# Patient Record
Sex: Male | Born: 1995 | Race: Black or African American | Hispanic: No | Marital: Single | Smoking: Current every day smoker
Health system: Southern US, Community
[De-identification: ages and names within clinical notes are randomized; demographics above are authoritative.]

## PROBLEM LIST (undated history)

## (undated) DIAGNOSIS — F909 Attention-deficit hyperactivity disorder, unspecified type: Secondary | ICD-10-CM

## (undated) HISTORY — DX: Attention-deficit hyperactivity disorder, unspecified type: F90.9

---
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
## 2013-03-09 ENCOUNTER — Ambulatory Visit (INDEPENDENT_AMBULATORY_CARE_PROVIDER_SITE_OTHER): Payer: Medicaid Other | Admitting: Family Medicine

## 2013-03-09 ENCOUNTER — Encounter: Payer: Self-pay | Admitting: Family Medicine

## 2013-03-09 VITALS — BP 110/68 | HR 68 | Temp 98.9°F | Resp 14 | Ht 74.5 in | Wt 203.0 lb

## 2013-03-09 DIAGNOSIS — F909 Attention-deficit hyperactivity disorder, unspecified type: Secondary | ICD-10-CM

## 2013-03-09 MED ORDER — AMPHETAMINE-DEXTROAMPHET ER 20 MG PO CP24: 20.0000 mg | ORAL_CAPSULE | ORAL | Status: DC

## 2013-03-09 NOTE — Progress Notes (Signed)
Subjective:    Patient ID: Matthew Lucero, male    DOB: [birth year], 17 y.o.   MRN: [id number]  HPI 09/23/12 Patient reports an itchy rash on both forearms and his face for 5-6 days now. He got it while working outside. He believes his poison oak. He is extremely pruritic. He is an erythematous maculo papular rash on both forearms as well as his face. There are no vesicles.  He denies fevers or chills. He has tried over-the-counter creams without benefit.  He has had a history of ADD. He was previously treated in this clinic with Adderall XR 20 mg by mouth daily. He would like to resume the medication. 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 He states he needs medication to help him focus and studying school.  His been off medication for over a year and reports a difficult time focusing in high school. He was able to maintain attention in high school due to due fact that some of classes relatively easy. However he is concerned about some of the more difficult classes.  At that time, my plan was: 1. Contact dermatitis - predniSONE (DELTASONE) 20 MG tablet; 3 tabs poqday 1-2, 2 tabs poqday 3-4, 1 tab poqday 5-6  Dispense: 12 tablet; Refill: 0   2. ADHD (attention deficit hyperactivity disorder) Once patient is off prednisone, I would gladly refill his Adderall XR 20 mg by mouth every morning..  03/09/13 Patient is scheduled to start [employer] in January. He did not attend this semester due to problems with his financial aid. He is scheduling his Spring classes now. He is requesting a refill on Adderall XR 20 mg by mouth daily. He has difficult time focusing in difficult classes. He also has a difficult time with organization and studying.  He denies any, of hyperactivity or impulsivity. He denies any chest pain or palpitations. He denies any  insomnia. He denies any anxiety problems. He has a normal appetite and is at  an appropriate weight for his height  sReview of Systems  All other systems reviewed and are negative.   Past Medical History  Diagnosis Date  . ADHD (attention deficit hyperactivity disorder)    No current outpatient prescriptions on file prior to visit.   No current facility-administered medications on file prior to visit.   No Known Allergies History   Social History  . Marital Status: Single    Spouse Name: N/A    Number of Children: N/A  . Years of Education: N/A   Occupational History  . Not on file.   Social History Main Topics  . Smoking status: Current Every Day Smoker    Types: Cigarettes, Cigars  . Smokeless tobacco: Not on file     Comment: 2-3 cigs a day/Occasional cigar  . Alcohol Use: No  . Drug Use: No  . Sexual Activity: Not on file   Other Topics Concern  . Not on file   Social History Narrative  . No narrative on file       Objective:   Physical Exam  Vitals reviewed. Constitutional: He appears well-developed and well-nourished.  Cardiovascular: Normal rate and regular rhythm.   Pulmonary/Chest: Effort normal and breath sounds normal.  Skin: Skin is warm. Rash noted. There is erythema.          Assessment & Plan:  1. ADHD (attention deficit hyperactivity disorder) Resume Adderall XR 20 mg by mouth every morning. Recheck in one month. I recommended drug holidays on weekends and prolonged vacations. He is to call me immediately if he develops chest palpitations, anxiety, weight loss, insomnia. - amphetamine-dextroamphetamine (ADDERALL XR) 20 MG 24 hr capsule; Take 1 capsule (20 mg total) by mouth every morning.  Dispense: 30 capsule; Refill: 0

## 2013-03-12 ENCOUNTER — Emergency Department (HOSPITAL_COMMUNITY)
Admission: EM | Admit: 2013-03-12 | Discharge: 2013-03-12 | Disposition: A | Discharge status: Home / Self Care | Payer: Medicaid Other | Attending: Emergency Medicine | Admitting: Emergency Medicine

## 2013-03-12 ENCOUNTER — Emergency Department (HOSPITAL_COMMUNITY): Payer: Medicaid Other

## 2013-03-12 ENCOUNTER — Encounter (HOSPITAL_COMMUNITY): Payer: Self-pay | Admitting: Emergency Medicine

## 2013-03-12 DIAGNOSIS — R112 Nausea with vomiting, unspecified: Secondary | ICD-10-CM | POA: Insufficient documentation

## 2013-03-12 DIAGNOSIS — R079 Chest pain, unspecified: Secondary | ICD-10-CM

## 2013-03-12 DIAGNOSIS — R109 Unspecified abdominal pain: Secondary | ICD-10-CM | POA: Insufficient documentation

## 2013-03-12 DIAGNOSIS — R072 Precordial pain: Secondary | ICD-10-CM | POA: Insufficient documentation

## 2013-03-12 DIAGNOSIS — Z79899 Other long term (current) drug therapy: Secondary | ICD-10-CM | POA: Insufficient documentation

## 2013-03-12 DIAGNOSIS — F909 Attention-deficit hyperactivity disorder, unspecified type: Secondary | ICD-10-CM | POA: Insufficient documentation

## 2013-03-12 DIAGNOSIS — F17200 Nicotine dependence, unspecified, uncomplicated: Secondary | ICD-10-CM | POA: Insufficient documentation

## 2013-03-12 DIAGNOSIS — J029 Acute pharyngitis, unspecified: Secondary | ICD-10-CM

## 2013-03-12 MED ORDER — RANITIDINE HCL 150 MG PO CAPS: 150.0000 mg | ORAL_CAPSULE | Freq: Every day | ORAL | Status: DC

## 2013-03-12 NOTE — ED Provider Notes (Signed)
CSN: [id number]     Arrival date & time 03/12/13  1544 History  This chart was scribed for non-physician practitioner Fayrene Helper, PA-C working with Bonnita Levan. Bernette Mayers, MD by Danella Maiers, ED Scribe. This patient was seen in room TR09C/TR09C and the patient's care was started at 5:18 PM.  Chief Complaint  Patient presents with  . Chest Pain  . URI   Patient is a 17 y.o. male presenting with chest pain and URI. The history is provided by the patient. No language interpreter was used.  Chest Pain Pain location:  Substernal area Pain quality: burning and stabbing   Pain radiates to:  Does not radiate Pain severity:  Mild Duration:  12 hours Progression:  Resolved Chronicity:  New Context: at rest   Associated symptoms: abdominal pain, diaphoresis, dysphagia, fever, nausea and vomiting   Associated symptoms: no cough, no dizziness, no headache and no shortness of breath   URI Presenting symptoms: congestion, fever and sore throat   Presenting symptoms: no cough and no ear pain   Severity:  Mild Onset quality:  Gradual Duration:  12 hours Timing:  Constant Progression:  Worsening Chronicity:  New Relieved by:  Nothing Worsened by:  Nothing tried Associated symptoms: no headaches    HPI Comments: Matthew Lucero is a 17 y.o. male who presents to the Emergency Department complaining of URI and stabbing, burning CP that woke him from sleep at 5am this morning. He states the CP has mostly resolved. He reports throat pain and pain with swallowing, subjective fever, chills, diaphoresis, postnasal drip, congestion.  He reports nausea and emesis with resulting abdominal pain and not being able to keep food down today, although he has been able to keep water down. He ate at 10pm last night. He denies h/o CP. He denies recent surgeries, recent travels, recently sitting for long periods, h/o DVT or PE. He denies headache, cough, SOB, rash, ear pain, dizziness, lightheadedness. Denies hematochezia,  hemoptysis, or melena.  He smokes 1-3 cigarettes per day.  No FH of premature cardiac death.  No specific treatment tried. Report wrestling with friends yesterday and also stay out in the cold weather.     Past Medical History  Diagnosis Date  . ADHD (attention deficit hyperactivity disorder)    History reviewed. No pertinent past surgical history. History reviewed. No pertinent family history. History  Substance Use Topics  . Smoking status: Current Every Day Smoker    Types: Cigarettes, Cigars  . Smokeless tobacco: Not on file     Comment: 2-3 cigs a day/Occasional cigar  . Alcohol Use: No    Review of Systems  Constitutional: Positive for fever, chills and diaphoresis.  HENT: Positive for congestion, postnasal drip, sore throat and trouble swallowing. Negative for ear pain.   Respiratory: Negative for cough and shortness of breath.   Cardiovascular: Positive for chest pain.  Gastrointestinal: Positive for nausea, vomiting and abdominal pain.  Skin: Negative for rash.  Neurological: Negative for dizziness, light-headedness and headaches.    Allergies  Review of patient's allergies indicates no known allergies.  Home Medications   Current Outpatient Rx  Name  Route  Sig  Dispense  Refill  . amphetamine-dextroamphetamine (ADDERALL XR) 20 MG 24 hr capsule   Oral   Take 1 capsule (20 mg total) by mouth every morning.   30 capsule   0    BP 128/72  Pulse 84  Temp(Src) 97.9 F (36.6 C) (Oral)  Resp 18  SpO2 99% Physical Exam  Nursing note and vitals reviewed. Constitutional: He is oriented to person, place, and time. He appears well-developed and well-nourished. No distress.  HENT:  Head: Normocephalic and atraumatic.  Eyes: EOM are normal.  Neck: Neck supple. No tracheal deviation present.  Cardiovascular: Normal rate and regular rhythm.   Pulmonary/Chest: Effort normal and breath sounds normal. No respiratory distress.  ttp on the chest wall without crepitus,  emphysema, paradoxical chest movement.  Musculoskeletal: Normal range of motion.  Neurological: He is alert and oriented to person, place, and time.  Skin: Skin is warm and dry.  Psychiatric: He has a normal mood and affect. His behavior is normal.    ED Course  Procedures (including critical care time) Medications - No data to display  DIAGNOSTIC STUDIES: Oxygen Saturation is 99% on RA, normal by my interpretation.    COORDINATION OF CARE: 5:25 PM- Discussed treatment plan with pt which includes CXR. Pt agrees to plan.  Pt with CP atypical for ACS.  Is PERC negative.  Is afebrile, VSS.  No acute emergent medical condition, no active chest pain.  Pain could be GERD based on description, but also has some MSK chest wall pain.  Will d/c with zantac, return precaution given.  Smoking cessation discussed.  Otherwise ECG without acute ischemic changes.  Pt currently sxs free.  CXR neg.    Labs Review Labs Reviewed - No data to display Imaging Review Dg Chest 2 View  03/12/2013   CLINICAL DATA:  Chest pain  EXAM: CHEST  2 VIEW  COMPARISON:  No prior.  FINDINGS: Mediastinum and hilar structures normal. Lungs are clear. Heart size normal. No acute bony abnormality. No pneumothorax.  IMPRESSION: No active cardiopulmonary disease.   Electronically Signed   By: Maisie Fus  Register   On: 03/12/2013 16:28    EKG Interpretation     Ventricular Rate:  82 PR Interval:  170 QRS Duration: 86 QT Interval:  356 QTC Calculation: 415 R Axis:   87 Text Interpretation:  Normal sinus rhythm with sinus arrhythmia Normal ECG No old tracing to compare            MDM   1. Chest pain at rest   2. Sore throat    BP 128/72  Pulse 84  Temp(Src) 97.9 F (36.6 C) (Oral)  Resp 18  SpO2 99%  I have reviewed nursing notes and vital signs. I personally reviewed the imaging tests through PACS system  I reviewed available ER/hospitalization records thought the EMR   I personally performed the  services described in this documentation, which was scribed in my presence. The recorded information has been reviewed and is accurate.    Fayrene Helper, PA-C 03/12/13 1747

## 2013-03-12 NOTE — ED Notes (Signed)
Pt reports waking up this am with chest pain, throat pain, abd pain and congestion. Denies cough. ekg done at triage, no distress noted.

## 2013-03-12 NOTE — ED Provider Notes (Signed)
Medical screening examination/treatment/procedure(s) were performed by non-physician practitioner and as supervising physician I was immediately available for consultation/collaboration.   Charles B. Bernette Mayers, MD 03/12/13 2050

## 2013-03-12 NOTE — ED Notes (Signed)
Pt st's when he woke up this am he had sore throat and nonproductive cough.  Pt also c/o nausea without vomiting.

## 2013-07-18 ENCOUNTER — Emergency Department (HOSPITAL_COMMUNITY)
Admission: EM | Admit: 2013-07-18 | Discharge: 2013-07-18 | Disposition: A | Discharge status: Home / Self Care | Payer: Medicaid Other | Attending: Emergency Medicine | Admitting: Emergency Medicine

## 2013-07-18 ENCOUNTER — Encounter (HOSPITAL_COMMUNITY): Payer: Self-pay | Admitting: Emergency Medicine

## 2013-07-18 DIAGNOSIS — Z79899 Other long term (current) drug therapy: Secondary | ICD-10-CM | POA: Insufficient documentation

## 2013-07-18 DIAGNOSIS — F17200 Nicotine dependence, unspecified, uncomplicated: Secondary | ICD-10-CM | POA: Insufficient documentation

## 2013-07-18 DIAGNOSIS — R112 Nausea with vomiting, unspecified: Secondary | ICD-10-CM | POA: Insufficient documentation

## 2013-07-18 DIAGNOSIS — F909 Attention-deficit hyperactivity disorder, unspecified type: Secondary | ICD-10-CM | POA: Insufficient documentation

## 2013-07-18 DIAGNOSIS — R109 Unspecified abdominal pain: Secondary | ICD-10-CM | POA: Insufficient documentation

## 2013-07-18 LAB — COMPREHENSIVE METABOLIC PANEL
ALBUMIN: 4.5 g/dL (ref 3.5–5.2)
ALT: 12 U/L (ref 0–53)
AST: 22 U/L (ref 0–37)
Alkaline Phosphatase: 89 U/L (ref 39–117)
BUN: 10 mg/dL (ref 6–23)
CO2: 27 mEq/L (ref 19–32)
Calcium: 9.7 mg/dL (ref 8.4–10.5)
Chloride: 99 mEq/L (ref 96–112)
Creatinine, Ser: 0.99 mg/dL (ref 0.50–1.35)
GLUCOSE: 94 mg/dL (ref 70–99)
POTASSIUM: 4.1 meq/L (ref 3.7–5.3)
Sodium: 140 mEq/L (ref 137–147)
Total Bilirubin: 0.8 mg/dL (ref 0.3–1.2)
Total Protein: 7.5 g/dL (ref 6.0–8.3)

## 2013-07-18 LAB — CBC WITH DIFFERENTIAL/PLATELET
BASOS ABS: 0 10*3/uL (ref 0.0–0.1)
Basophils Relative: 0 % (ref 0–1)
EOS ABS: 0.3 10*3/uL (ref 0.0–0.7)
EOS PCT: 4 % (ref 0–5)
HCT: 45.7 % (ref 39.0–52.0)
Hemoglobin: 16 g/dL (ref 13.0–17.0)
LYMPHS ABS: 1.1 10*3/uL (ref 0.7–4.0)
LYMPHS PCT: 17 % (ref 12–46)
MCH: 30.5 pg (ref 26.0–34.0)
MCHC: 35 g/dL (ref 30.0–36.0)
MCV: 87.2 fL (ref 78.0–100.0)
MONO ABS: 0.4 10*3/uL (ref 0.1–1.0)
MONOS PCT: 7 % (ref 3–12)
NEUTROS ABS: 4.5 10*3/uL (ref 1.7–7.7)
Neutrophils Relative %: 72 % (ref 43–77)
Platelets: 170 10*3/uL (ref 150–400)
RBC: 5.24 MIL/uL (ref 4.22–5.81)
RDW: 12.9 % (ref 11.5–15.5)
WBC: 6.2 10*3/uL (ref 4.0–10.5)

## 2013-07-18 LAB — URINALYSIS, ROUTINE W REFLEX MICROSCOPIC
Bilirubin Urine: NEGATIVE
Glucose, UA: NEGATIVE mg/dL
HGB URINE DIPSTICK: NEGATIVE
KETONES UR: NEGATIVE mg/dL
Nitrite: NEGATIVE
PH: 7 (ref 5.0–8.0)
Protein, ur: NEGATIVE mg/dL
Specific Gravity, Urine: 1.024 (ref 1.005–1.030)
UROBILINOGEN UA: 1 mg/dL (ref 0.0–1.0)

## 2013-07-18 LAB — URINE MICROSCOPIC-ADD ON

## 2013-07-18 LAB — LIPASE, BLOOD: Lipase: 14 U/L (ref 11–59)

## 2013-07-18 MED ORDER — PROMETHAZINE HCL 25 MG PO TABS: 25.0000 mg | ORAL_TABLET | Freq: Four times a day (QID) | ORAL | Status: DC | PRN

## 2013-07-18 NOTE — Discharge Instructions (Signed)
Take nausea medication as directed if morning sickness occurs. Return to emergency department if you develop worsening symptoms, severe testicle pain, unremitting abdominal pain, fever/chills, or you become unable to tolerate oral fluids.    Abdominal Pain, Adult Many things can cause belly (abdominal) pain. Most times, the belly pain is not dangerous. Many cases of belly pain can be watched and treated at home. HOME CARE   Do not take medicines that help you go poop (laxatives) unless told to by your doctor.  Only take medicine as told by your doctor.  Eat or drink as told by your doctor. Your doctor will tell you if you should be on a special diet. GET HELP IF:  You do not know what is causing your belly pain.  You have belly pain while you are sick to your stomach (nauseous) or have runny poop (diarrhea).  You have pain while you pee or poop.  Your belly pain wakes you up at night.  You have belly pain that gets worse or better when you eat.  You have belly pain that gets worse when you eat fatty foods. GET HELP RIGHT AWAY IF:   The pain does not go away within 2 hours.  You have a fever.  You keep throwing up (vomiting).  The pain changes and is only in the right or left part of the belly.  You have bloody or tarry looking poop. MAKE SURE YOU:   Understand these instructions.  Will watch your condition.  Will get help right away if you are not doing well or get worse. Document Released: 10/03/2007 Document Revised: 02/04/2013 Document Reviewed: 12/24/2012 [facility] SurgeryExit[facility] Patient Information 2014 [place], [place].

## 2013-07-18 NOTE — ED Provider Notes (Signed)
CSN: [id number]     Arrival date & time 07/18/13  [id number] History   First MD Initiated Contact with Patient 07/18/13 [phone]     Chief Complaint  Patient presents with  . Abdominal Pain     (Consider location/radiation/quality/duration/timing/severity/associated sxs/prior Treatment) HPI [age] yo male presents with intermittent lower abdominal pain x 2 weeks. Pain was gradual in onset. Pain described as crampy in nature and occurs every other morning, lasts about 1 hour and then resolves on its own. Pain is rated at 6-7/10 when present, pain currently rated at 0/10. Pain is worse with movement. Pain does not appear to be associated with eating. Patient denies hx of similar sxs. Admits to N/V this morning x 10-15 episodes which occurs everytime he has the pain. Denies diarrhea, constipation, CP, SOB, dysuria, hematuria, bloody stools, penile discharge, pain with ejaculation, or fever/chills. Admits to new sexual partner about 3 weeks ago. Denies additional partners. No hx of STDs. Denies hx of kidney stones.  Past Medical History  Diagnosis Date  . ADHD (attention deficit hyperactivity disorder)    History reviewed. No pertinent past surgical history. History reviewed. No pertinent family history. History  Substance Use Topics  . Smoking status: Current Every Day Smoker    Types: Cigarettes, Cigars  . Smokeless tobacco: Not on file     Comment: 2-3 cigs a day/Occasional cigar  . Alcohol Use: No    Review of Systems  All other systems reviewed and are negative.      Allergies  Review of patient's allergies indicates no known allergies.  Home Medications   Current Outpatient Rx  Name  Route  Sig  Dispense  Refill  . amphetamine-dextroamphetamine (ADDERALL XR) 20 MG 24 hr capsule   Oral   Take 20 mg by mouth daily.         . promethazine (PHENERGAN) 25 MG tablet   Oral   Take 1 tablet (25 mg total) by mouth every 6 (six) hours as needed for nausea or vomiting.   15 tablet   0    . ranitidine (ZANTAC) 150 MG tablet   Oral   Take 150 mg by mouth daily as needed for heartburn.          BP 113/70  Pulse 57  Temp(Src) 98.1 F (36.7 C) (Oral)  Resp 20  Ht 6\' 3"  (1.905 m)  Wt 205 lb (92.987 kg)  BMI 25.62 kg/m2  SpO2 100% Physical Exam  Nursing note and vitals reviewed. Constitutional: He is oriented to person, place, and time. He appears well-developed and well-nourished. No distress.  HENT:  Head: Normocephalic and atraumatic.  Eyes: Conjunctivae are normal. No scleral icterus.  Neck: No JVD present. No tracheal deviation present.  Cardiovascular: Normal rate and regular rhythm.  Exam reveals no gallop and no friction rub.   No murmur heard. Pulmonary/Chest: Effort normal and breath sounds normal. No respiratory distress. He has no wheezes. He has no rhonchi. He has no rales.  Abdominal: Soft. Bowel sounds are normal. He exhibits no distension. There is no hepatosplenomegaly. There is no tenderness. There is no rigidity, no rebound, no guarding, no tenderness at McBurney's point and negative Murphy's sign. Hernia confirmed negative in the right inguinal area and confirmed negative in the left inguinal area.  Genitourinary: Testes normal and penis normal. Right testis shows no mass, no swelling and no tenderness. Left testis shows no mass, no swelling and no tenderness. Circumcised. No penile erythema or penile tenderness. No discharge found.  Musculoskeletal: Normal range of motion. He exhibits no edema.  Neurological: He is alert and oriented to person, place, and time.  Skin: Skin is warm and dry. He is not diaphoretic.  Psychiatric: He has a normal mood and affect. His behavior is normal.    ED Course  Procedures (including critical care time) Labs Review Labs Reviewed  URINALYSIS, ROUTINE W REFLEX MICROSCOPIC - Abnormal; Notable for the following:    Leukocytes, UA TRACE (*)    All other components within normal limits  GC/CHLAMYDIA PROBE AMP   COMPREHENSIVE METABOLIC PANEL  CBC WITH DIFFERENTIAL  LIPASE, BLOOD  URINE MICROSCOPIC-ADD ON   Imaging Review No results found.   EKG Interpretation None      MDM   Final diagnoses:  Intermittent abdominal pain  Nausea & vomiting   Patient afebrile with normal VS.  UA shows trace leukocytes otherwise belly labwork unremarkable.  No evidence of hematuria, lipase negative, and patient with soft non-tender abdominal exam. Doubt pancreatitis, appendicitis, cholecystitis, ureterolithiasis, obstruction, testicular torsion.  G/C pending.   Patient is currently asymptomatic in NAD. Tolerating POs in ED. Plan to give patient nausea medication for home in case his sxs should reoccur. Patient invited to return to ED at any point should his sxs return or worsen. Patient agrees with plan. Discharged in good condition.     Meds given in ED:  Medications - No data to display  Discharge Medication List as of 07/18/2013  9:56 AM    START taking these medications   Details  promethazine (PHENERGAN) 25 MG tablet Take 1 tablet (25 mg total) by mouth every 6 (six) hours as needed for nausea or vomiting., Starting 07/18/2013, Until Discontinued, Print         Rudene Anda, PA-C 07/19/13 2204

## 2013-07-18 NOTE — ED Notes (Signed)
States has had 2 week history of morning nausea and abdominal pain. States sometimes after he gets up in the mornings he becomes nauseated and has dry heaves. States he sometimes drinks water to have something to throw up. States lasts for about an hour. States got an rx for zantac some time ago for similar symptoms states only took a few tablets. Denies any sx at this time. He has gingerale at bedside.

## 2013-07-18 NOTE — ED Notes (Signed)
Mid level has collected gc probe

## 2013-07-18 NOTE — ED Notes (Signed)
Midlevel given swab to collect gc

## 2013-07-18 NOTE — ED Notes (Signed)
States about every other morning when he gets up in the morning he has nausea and lower abominal pain for about an hour. States after that he feels better.

## 2013-07-19 ENCOUNTER — Encounter (HOSPITAL_COMMUNITY): Payer: Self-pay | Admitting: Emergency Medicine

## 2013-07-19 ENCOUNTER — Emergency Department (HOSPITAL_COMMUNITY)
Admission: EM | Admit: 2013-07-19 | Discharge: 2013-07-19 | Disposition: A | Discharge status: Home / Self Care | Payer: Medicaid Other | Attending: Emergency Medicine | Admitting: Emergency Medicine

## 2013-07-19 DIAGNOSIS — F909 Attention-deficit hyperactivity disorder, unspecified type: Secondary | ICD-10-CM | POA: Insufficient documentation

## 2013-07-19 DIAGNOSIS — Z79899 Other long term (current) drug therapy: Secondary | ICD-10-CM | POA: Insufficient documentation

## 2013-07-19 DIAGNOSIS — F17200 Nicotine dependence, unspecified, uncomplicated: Secondary | ICD-10-CM | POA: Insufficient documentation

## 2013-07-19 DIAGNOSIS — R112 Nausea with vomiting, unspecified: Secondary | ICD-10-CM | POA: Insufficient documentation

## 2013-07-19 LAB — I-STAT CHEM 8, ED
BUN: 10 mg/dL (ref 6–23)
CALCIUM ION: 1.25 mmol/L — AB (ref 1.12–1.23)
CREATININE: 1.2 mg/dL (ref 0.50–1.35)
Chloride: 102 mEq/L (ref 96–112)
GLUCOSE: 92 mg/dL (ref 70–99)
HCT: 45 % (ref 39.0–52.0)
Hemoglobin: 15.3 g/dL (ref 13.0–17.0)
Potassium: 3.9 mEq/L (ref 3.7–5.3)
Sodium: 141 mEq/L (ref 137–147)
TCO2: 26 mmol/L (ref 0–100)

## 2013-07-19 MED ORDER — ONDANSETRON HCL 4 MG/2ML IJ SOLN
4.0000 mg | Freq: Once | INTRAMUSCULAR | Status: AC
  Administered 2013-07-19: 4 mg via INTRAVENOUS
  Filled 2013-07-19: qty 2

## 2013-07-19 MED ORDER — SODIUM CHLORIDE 0.9 % IV SOLN
1000.0000 mL | INTRAVENOUS | Status: DC
  Administered 2013-07-19: 1000 mL via INTRAVENOUS

## 2013-07-19 MED ORDER — SODIUM CHLORIDE 0.9 % IV SOLN
1000.0000 mL | Freq: Once | INTRAVENOUS | Status: AC
  Administered 2013-07-19: 1000 mL via INTRAVENOUS

## 2013-07-19 MED ORDER — PROMETHAZINE HCL 25 MG PO TABS: 25.0000 mg | ORAL_TABLET | Freq: Four times a day (QID) | ORAL | Status: DC | PRN

## 2013-07-19 NOTE — ED Provider Notes (Signed)
CSN: [id number]     Arrival date & time 07/19/13  0846 History   First MD Initiated Contact with Patient 07/19/13 [phone]     Chief Complaint  Patient presents with  . Abdominal Pain  . Emesis     (Consider location/radiation/quality/duration/timing/severity/associated sxs/prior Treatment) HPI Patient reports nausea vomiting or the past several days.  Was seen in emergency department yesterday and was feeling better at time of discharge but lost his prescription for Phenergan.  He states he's been vomiting through the morning.  No hematemesis.  Occasional abdominal cramping.  No watery stools but does report some loose or soft stools.  No fevers or chills.  States he feels generally weak at this time.  Did not eat or drink anything when he returned home yesterday.  No other complaints.  No prior history of severe nausea vomiting diarrhea.  No history of gastroparesis.  No recent sick contacts    Past Medical History  Diagnosis Date  . ADHD (attention deficit hyperactivity disorder)    History reviewed. No pertinent past surgical history. No family history on file. History  Substance Use Topics  . Smoking status: Current Every Day Smoker    Types: Cigarettes, Cigars  . Smokeless tobacco: Not on file     Comment: 2-3 cigs a day/Occasional cigar  . Alcohol Use: No    Review of Systems  All other systems reviewed and are negative.      Allergies  Review of patient's allergies indicates no known allergies.  Home Medications   Current Outpatient Rx  Name  Route  Sig  Dispense  Refill  . amphetamine-dextroamphetamine (ADDERALL XR) 20 MG 24 hr capsule   Oral   Take 20 mg by mouth daily.         . ranitidine (ZANTAC) 150 MG tablet   Oral   Take 150 mg by mouth daily as needed for heartburn.         . promethazine (PHENERGAN) 25 MG tablet   Oral   Take 1 tablet (25 mg total) by mouth every 6 (six) hours as needed for nausea or vomiting.   15 tablet   0    BP 114/72   Pulse 62  Temp(Src) 98.5 F (36.9 C) (Oral)  Resp 14  Ht 6' 2.5" (1.892 m)  Wt 205 lb (92.987 kg)  BMI 25.98 kg/m2  SpO2 99% Physical Exam  Nursing note and vitals reviewed. Constitutional: He is oriented to person, place, and time. He appears well-developed and well-nourished.  HENT:  Head: Normocephalic and atraumatic.  Dry mucous membranes  Eyes: EOM are normal.  Neck: Normal range of motion.  Cardiovascular: Normal rate, regular rhythm, normal heart sounds and intact distal pulses.   Pulmonary/Chest: Effort normal and breath sounds normal. No respiratory distress.  Abdominal: Soft. He exhibits no distension. There is no tenderness.  Musculoskeletal: Normal range of motion.  Neurological: He is alert and oriented to person, place, and time.  Skin: Skin is warm and dry.  Psychiatric: He has a normal mood and affect. Judgment normal.    ED Course  Procedures (including critical care time) Labs Review Labs Reviewed  I-STAT CHEM 8, ED - Abnormal; Notable for the following:    Calcium, Ion 1.25 (*)    All other components within normal limits   Imaging Review No results found.   EKG Interpretation None      MDM   Final diagnoses:  Nausea & vomiting    10:47 AM Patient feels much  better after IV fluids.  Home with Phenergan.  Abdomen benign.  Likely viral in nature.    Lyanne CoKevin M Etan Vasudevan, MD 07/19/13 1047

## 2013-07-19 NOTE — ED Notes (Signed)
Pt c/o mid abdominal pain during vomiting x 1-2 weeks. Pt reports that he has been vomiting daily for past 3 days. Pt seen here yesterday for same and given prescription but pt lost the prescription by the time he got home.

## 2013-07-20 LAB — GC/CHLAMYDIA PROBE AMP
CT Probe RNA: NEGATIVE
GC PROBE AMP APTIMA: NEGATIVE

## 2013-07-20 NOTE — ED Provider Notes (Signed)
Medical screening examination/treatment/procedure(s) were performed by non-physician practitioner and as supervising physician I was immediately available for consultation/collaboration.   EKG Interpretation None        Candyce ChurnJohn David Tzipora Mcinroy III, MD 07/20/13 [phone]

## 2013-09-07 ENCOUNTER — Encounter (HOSPITAL_COMMUNITY): Payer: Self-pay | Admitting: Emergency Medicine

## 2013-09-07 ENCOUNTER — Emergency Department (HOSPITAL_COMMUNITY)
Admission: EM | Admit: 2013-09-07 | Discharge: 2013-09-07 | Disposition: A | Discharge status: Home / Self Care | Payer: Medicaid Other | Attending: Emergency Medicine | Admitting: Emergency Medicine

## 2013-09-07 DIAGNOSIS — R109 Unspecified abdominal pain: Secondary | ICD-10-CM

## 2013-09-07 DIAGNOSIS — M549 Dorsalgia, unspecified: Secondary | ICD-10-CM | POA: Insufficient documentation

## 2013-09-07 DIAGNOSIS — F909 Attention-deficit hyperactivity disorder, unspecified type: Secondary | ICD-10-CM | POA: Insufficient documentation

## 2013-09-07 DIAGNOSIS — J069 Acute upper respiratory infection, unspecified: Secondary | ICD-10-CM

## 2013-09-07 DIAGNOSIS — R1013 Epigastric pain: Secondary | ICD-10-CM

## 2013-09-07 DIAGNOSIS — R197 Diarrhea, unspecified: Secondary | ICD-10-CM | POA: Insufficient documentation

## 2013-09-07 DIAGNOSIS — F17200 Nicotine dependence, unspecified, uncomplicated: Secondary | ICD-10-CM | POA: Insufficient documentation

## 2013-09-07 DIAGNOSIS — Z79899 Other long term (current) drug therapy: Secondary | ICD-10-CM | POA: Insufficient documentation

## 2013-09-07 DIAGNOSIS — R1110 Vomiting, unspecified: Secondary | ICD-10-CM

## 2013-09-07 DIAGNOSIS — R42 Dizziness and giddiness: Secondary | ICD-10-CM | POA: Insufficient documentation

## 2013-09-07 LAB — CBC WITH DIFFERENTIAL/PLATELET
BASOS PCT: 0 % (ref 0–1)
Basophils Absolute: 0 10*3/uL (ref 0.0–0.1)
EOS ABS: 0.1 10*3/uL (ref 0.0–0.7)
Eosinophils Relative: 1 % (ref 0–5)
HCT: 43.8 % (ref 39.0–52.0)
HEMOGLOBIN: 14.8 g/dL (ref 13.0–17.0)
Lymphocytes Relative: 7 % — ABNORMAL LOW (ref 12–46)
Lymphs Abs: 0.7 10*3/uL (ref 0.7–4.0)
MCH: 29.7 pg (ref 26.0–34.0)
MCHC: 33.8 g/dL (ref 30.0–36.0)
MCV: 87.8 fL (ref 78.0–100.0)
MONOS PCT: 7 % (ref 3–12)
Monocytes Absolute: 0.8 10*3/uL (ref 0.1–1.0)
NEUTROS PCT: 85 % — AB (ref 43–77)
Neutro Abs: 9.2 10*3/uL — ABNORMAL HIGH (ref 1.7–7.7)
Platelets: 159 10*3/uL (ref 150–400)
RBC: 4.99 MIL/uL (ref 4.22–5.81)
RDW: 12.8 % (ref 11.5–15.5)
WBC: 10.8 10*3/uL — ABNORMAL HIGH (ref 4.0–10.5)

## 2013-09-07 LAB — COMPREHENSIVE METABOLIC PANEL
ALBUMIN: 4.4 g/dL (ref 3.5–5.2)
ALK PHOS: 89 U/L (ref 39–117)
ALT: 12 U/L (ref 0–53)
AST: 21 U/L (ref 0–37)
BUN: 9 mg/dL (ref 6–23)
CO2: 26 mEq/L (ref 19–32)
Calcium: 9.4 mg/dL (ref 8.4–10.5)
Chloride: 101 mEq/L (ref 96–112)
Creatinine, Ser: 1.02 mg/dL (ref 0.50–1.35)
GFR calc Af Amer: >90 mL/min (ref >90)
GFR calc non Af Amer: >90 mL/min (ref >90)
Glucose, Bld: 90 mg/dL (ref 70–99)
Potassium: 4.3 mEq/L (ref 3.7–5.3)
Sodium: 139 mEq/L (ref 137–147)
TOTAL PROTEIN: 7.6 g/dL (ref 6.0–8.3)
Total Bilirubin: 1.6 mg/dL — ABNORMAL HIGH (ref 0.3–1.2)

## 2013-09-07 LAB — URINALYSIS, ROUTINE W REFLEX MICROSCOPIC
Glucose, UA: NEGATIVE mg/dL
Hgb urine dipstick: NEGATIVE
Ketones, ur: >80 mg/dL — AB
Leukocytes, UA: NEGATIVE
Nitrite: NEGATIVE
PH: 6 (ref 5.0–8.0)
Protein, ur: NEGATIVE mg/dL
SPECIFIC GRAVITY, URINE: 1.025 (ref 1.005–1.030)
Urobilinogen, UA: 1 mg/dL (ref 0.0–1.0)

## 2013-09-07 LAB — LIPASE, BLOOD: LIPASE: 12 U/L (ref 11–59)

## 2013-09-07 MED ORDER — ONDANSETRON 4 MG PO TBDP
8.0000 mg | ORAL_TABLET | Freq: Once | ORAL | Status: AC
  Administered 2013-09-07: 8 mg via ORAL
  Filled 2013-09-07: qty 2

## 2013-09-07 MED ORDER — ONDANSETRON 4 MG PO TBDP: ORAL_TABLET | ORAL | Status: DC

## 2013-09-07 MED ORDER — NAPROXEN 500 MG PO TABS: 500.0000 mg | ORAL_TABLET | Freq: Two times a day (BID) | ORAL | Status: DC

## 2013-09-07 MED ORDER — OXYCODONE-ACETAMINOPHEN 5-325 MG PO TABS
1.0000 | ORAL_TABLET | Freq: Once | ORAL | Status: AC
  Administered 2013-09-07: 1 via ORAL
  Filled 2013-09-07: qty 1

## 2013-09-07 NOTE — ED Notes (Signed)
Pt given water 

## 2013-09-07 NOTE — Discharge Instructions (Signed)
Take Zofran for nausea. Take Tylenol  and naproxen for pain. Gradually increase her diet. If your abdominal pain worsens, you develop fevers, persistent vomiting or if your pain moves to the right lower quadrant return immediately to see your physician or come to the Emergency Department.  Thank you If you were given medicines take as directed.  If you are on coumadin or contraceptives realize their levels and effectiveness is altered by many different medicines.  If you have any reaction (rash, tongues swelling, other) to the medicines stop taking and see a physician.   Please follow up as directed and return to the ER or see a physician for new or worsening symptoms.  Thank you. Filed Vitals:   09/07/13 1527 09/07/13 1846 09/07/13 2003  BP: 121/62 122/75 124/87  Pulse: 75 77 82  Temp: 99 F (37.2 C)    TempSrc: Oral    Resp: 18 18 16   SpO2: 99% 100% 100%

## 2013-09-07 NOTE — ED Provider Notes (Signed)
CSN: [id number]     Arrival date & time 09/07/13  1524 History   First MD Initiated Contact with Patient 09/07/13 2002     Chief Complaint  Patient presents with  . Abdominal Pain  . Emesis     (Consider location/radiation/quality/duration/timing/severity/associated sxs/prior Treatment) HPI Comments: 18 year old male with ADHD history presents with cough, epigastric pain and vomiting for the past 2 days. Patient had congestion and cough without fever and mild sore throat. Over the past 12 hours patient has had approximately 8-10 episodes of vomiting non-bilious nonbloody. Currently no abdominal pain. No known gallbladder or appendix issues. No new foods or medication.  Patient is a 18 y.o. male presenting with abdominal pain and vomiting. The history is provided by the patient.  Abdominal Pain Associated symptoms: diarrhea, nausea and vomiting   Associated symptoms: no chest pain, no chills, no dysuria, no fever and no shortness of breath   Emesis Associated symptoms: abdominal pain and diarrhea   Associated symptoms: no chills and no headaches     Past Medical History  Diagnosis Date  . ADHD (attention deficit hyperactivity disorder)    History reviewed. No pertinent past surgical history. History reviewed. No pertinent family history. History  Substance Use Topics  . Smoking status: Current Every Day Smoker    Types: Cigarettes, Cigars  . Smokeless tobacco: Not on file     Comment: 2-3 cigs a day/Occasional cigar  . Alcohol Use: No    Review of Systems  Constitutional: Positive for appetite change. Negative for fever and chills.  HENT: Negative for congestion.   Eyes: Negative for visual disturbance.  Respiratory: Negative for shortness of breath.   Cardiovascular: Negative for chest pain.  Gastrointestinal: Positive for nausea, vomiting, abdominal pain and diarrhea.  Genitourinary: Positive for flank pain. Negative for dysuria.  Musculoskeletal: Positive for back pain.  Negative for neck pain and neck stiffness.  Skin: Negative for rash.  Neurological: Positive for light-headedness. Negative for headaches. Speech difficulty: mild with coughing.      Allergies  Review of patient's allergies indicates no known allergies.  Home Medications   Prior to Admission medications   Medication Sig Start Date End Date Taking? Authorizing Provider  amphetamine-dextroamphetamine (ADDERALL XR) 20 MG 24 hr capsule Take 20 mg by mouth daily.   Yes Historical Provider, MD  ranitidine (ZANTAC) 150 MG tablet Take 150 mg by mouth daily as needed for heartburn.   Yes Historical Provider, MD  naproxen (NAPROSYN) 500 MG tablet Take 1 tablet (500 mg total) by mouth 2 (two) times daily. 09/07/13   Enid SkeensJoshua M Cavan Bearden, MD  ondansetron (ZOFRAN ODT) 4 MG disintegrating tablet 4mg  ODT q4 hours prn nausea/vomit 09/07/13   Enid SkeensJoshua M Kelyn Ponciano, MD   BP 124/87  Pulse 82  Temp(Src) 99 F (37.2 C) (Oral)  Resp 16  SpO2 100% Physical Exam  Nursing note and vitals reviewed. Constitutional: He is oriented to person, place, and time. He appears well-developed and well-nourished.  HENT:  Head: Normocephalic and atraumatic.  Dry mucous membranes  Eyes: Conjunctivae are normal. Right eye exhibits no discharge. Left eye exhibits no discharge.  Neck: Normal range of motion. Neck supple. No tracheal deviation present.  Cardiovascular: Normal rate and regular rhythm.   Pulmonary/Chest: Effort normal and breath sounds normal.  Abdominal: Soft. He exhibits no distension. There is tenderness. There is no guarding.  Musculoskeletal: He exhibits no edema.  Neurological: He is alert and oriented to person, place, and time.  Skin: Skin is warm. No  rash noted.  Psychiatric: He has a normal mood and affect.    ED Course  Procedures (including critical care time)  EMERGENCY DEPARTMENT BILIARY ULTRASOUND INTERPRETATION "Study: Limited Abdominal Ultrasound of the gallbladder and common bile  duct."  INDICATIONS: Abdominal pain and Nausea Indication: Multiple views of the gallbladder and common bile duct were obtained in real-time with a Multi-frequency probe." PERFORMED BY:  Myself IMAGES ARCHIVED?: Yes FINDINGS: Gallstones absent, Gallbladder wall normal in thickness, Sonographic Murphy's sign absent and Common bile duct normal in size LIMITATIONS: Bowel Gas INTERPRETATION: Normal   Labs Review Labs Reviewed  CBC WITH DIFFERENTIAL - Abnormal; Notable for the following:    WBC 10.8 (*)    Neutrophils Relative % 85 (*)    Neutro Abs 9.2 (*)    Lymphocytes Relative 7 (*)    All other components within normal limits  COMPREHENSIVE METABOLIC PANEL - Abnormal; Notable for the following:    Total Bilirubin 1.6 (*)    All other components within normal limits  URINALYSIS, ROUTINE W REFLEX MICROSCOPIC - Abnormal; Notable for the following:    Bilirubin Urine SMALL (*)    Ketones, ur >80 (*)    All other components within normal limits  LIPASE, BLOOD    Imaging Review No results found.   EKG Interpretation None      MDM   Final diagnoses:  Upper respiratory infection  Epigastric pain  Flank pain  Vomiting   Well-appearing healthy male with upper respiratory congestion her symptoms. On exam very mild epigastric tenderness. Bedside ultrasound did not show any gallstones or wall thickening. Lungs clear, normal heart rate and normal oxygen in ED. Chest x-ray initially ordered however on recheck patient felt well and most likely viral process. Discussed waiting for x-ray versus close followup outpatient, patient comfortable with follow up outpatient and reasons to return given. No concern for appendicitis at this time however discharge instructions explain reasons to return for signs of appendicitis. Patient well-appearing in discharge. Patient improved after Zofran given on arrival. Patient tolerated by mouth fluids.  Results and differential diagnosis were  discussed with the patient. Close follow up outpatient was discussed, patient comfortable with the plan.   Filed Vitals:   09/07/13 1527 09/07/13 1846 09/07/13 2003 09/07/13 2138  BP: 121/62 122/75 124/87 104/64  Pulse: 75 77 82 76  Temp: 99 F (37.2 C)     TempSrc: Oral     Resp: 18 18 16 16   SpO2: 99% 100% 100% 100%        Enid SkeensJoshua M Kedarius Aloisi, MD 09/07/13 2141

## 2013-09-07 NOTE — ED Notes (Signed)
Pt able to keep water down without emesis.

## 2013-09-07 NOTE — ED Notes (Signed)
Per pt sts he has had a sore throat for a few days and woke up this am and was having abdominal pain and vomiting. sts 10x.

## 2013-09-07 NOTE — ED Notes (Addendum)
Pt states he has vomited 6 times since arrival to the E.D. Pt states that he has been vomiting all day (10 times) pt states abdominal pain when he vomits and generalized with some focus on umbilicus. Pt denies vomiting since given zofran. Pt states no diarrhea. Pt states he has had a cold and feels like his CP and throat soreness started Sunday.

## 2013-09-07 NOTE — ED Notes (Signed)
Pt at front desk complaining of pain and nausea.

## 2013-12-21 ENCOUNTER — Emergency Department (HOSPITAL_COMMUNITY)
Admission: EM | Admit: 2013-12-21 | Discharge: 2013-12-21 | Disposition: A | Discharge status: Home / Self Care | Payer: Medicaid Other | Attending: Emergency Medicine | Admitting: Emergency Medicine

## 2013-12-21 ENCOUNTER — Encounter (HOSPITAL_COMMUNITY): Payer: Self-pay | Admitting: Emergency Medicine

## 2013-12-21 DIAGNOSIS — Y9289 Other specified places as the place of occurrence of the external cause: Secondary | ICD-10-CM | POA: Insufficient documentation

## 2013-12-21 DIAGNOSIS — F17200 Nicotine dependence, unspecified, uncomplicated: Secondary | ICD-10-CM | POA: Insufficient documentation

## 2013-12-21 DIAGNOSIS — Y9389 Activity, other specified: Secondary | ICD-10-CM | POA: Insufficient documentation

## 2013-12-21 DIAGNOSIS — T63461A Toxic effect of venom of wasps, accidental (unintentional), initial encounter: Secondary | ICD-10-CM | POA: Insufficient documentation

## 2013-12-21 DIAGNOSIS — T6391XA Toxic effect of contact with unspecified venomous animal, accidental (unintentional), initial encounter: Secondary | ICD-10-CM | POA: Insufficient documentation

## 2013-12-21 DIAGNOSIS — T63444A Toxic effect of venom of bees, undetermined, initial encounter: Secondary | ICD-10-CM

## 2013-12-21 DIAGNOSIS — Z8659 Personal history of other mental and behavioral disorders: Secondary | ICD-10-CM | POA: Diagnosis not present

## 2013-12-21 MED ORDER — DIPHENHYDRAMINE HCL 25 MG PO CAPS
50.0000 mg | ORAL_CAPSULE | Freq: Once | ORAL | Status: AC
  Administered 2013-12-21: 50 mg via ORAL
  Filled 2013-12-21: qty 2

## 2013-12-21 NOTE — ED Notes (Signed)
Pt reports that he was stung by a large bee yesterday at 3:00am. Pt has a large amount of swelling to his right lower arm. Extremity is warm to touch.

## 2013-12-21 NOTE — Discharge Instructions (Signed)

## 2013-12-21 NOTE — ED Provider Notes (Signed)
CSN: [id number]     Arrival date & time 12/21/13  1324 History   First MD Initiated Contact with Patient 12/21/13 0403     Chief Complaint  Patient presents with  . Insect Bite    HPI Pt reports swelling to his right hand and right arm after hornet bite yesterday. No breathing difficulty. No hives or rash.  Pt states as the day continued yesterday the swelling continued to worsen. Denies weakness or signficant pain of the right hand and arm. No elevation or ice during the day. States arm was at his side for most of the day. Symptoms are mild to moderate in severity. No other complaints   Past Medical History  Diagnosis Date  . ADHD (attention deficit hyperactivity disorder)    History reviewed. No pertinent past surgical history. No family history on file. History  Substance Use Topics  . Smoking status: Current Every Day Smoker    Types: Cigarettes, Cigars  . Smokeless tobacco: Not on file     Comment: 2-3 cigs a day/Occasional cigar  . Alcohol Use: No    Review of Systems  All other systems reviewed and are negative.     Allergies  Bee venom  Home Medications   Prior to Admission medications   Medication Sig Start Date End Date Taking? Authorizing Provider  amphetamine-dextroamphetamine (ADDERALL XR) 20 MG 24 hr capsule Take 20 mg by mouth See admin instructions. Only when is school   Yes Historical Provider, MD   BP 126/80  Pulse 79  Temp(Src) 98.2 F (36.8 C) (Oral)  Resp 18  SpO2 100% Physical Exam  Nursing note and vitals reviewed. Constitutional: He is oriented to person, place, and time. He appears well-developed and well-nourished.  HENT:  Head: Normocephalic.  Eyes: EOM are normal.  Neck: Normal range of motion.  Pulmonary/Chest: Effort normal.  Abdominal: He exhibits no distension.  Musculoskeletal: Normal range of motion.  Swelling of right hand and distal right forearm. Normal right radial pulse. Full ROM of fingers. Normal sensation.   Compartments in hand soft. Warmth throughout without erythema  Neurological: He is alert and oriented to person, place, and time.  Psychiatric: He has a normal mood and affect.    ED Course  Procedures (including critical care time) Labs Review Labs Reviewed - No data to display  Imaging Review No results found.   EKG Interpretation None      MDM   Final diagnoses:  Allergic reaction to bee sting, undetermined intent, initial encounter    This all appears to be localized allergic reaction. No signs of infection. Generalized warmth more from allergic reaction. Compartments soft without signs of compartment syndrome. Pt instructed to elevate right arm, apply ice, and begin antihistamines. He understands to return to ER for new or worsening symptoms. No signs of infection at this time. No abscess present    Lyanne Co, MD 12/21/13 1550

## 2013-12-21 NOTE — ED Notes (Signed)
NAD at this time.  

## 2014-08-31 ENCOUNTER — Ambulatory Visit (INDEPENDENT_AMBULATORY_CARE_PROVIDER_SITE_OTHER): Payer: Medicaid Other | Admitting: Family Medicine

## 2014-08-31 ENCOUNTER — Encounter: Payer: Self-pay | Admitting: Family Medicine

## 2014-08-31 VITALS — BP 118/70 | HR 80 | Temp 98.1°F | Resp 18 | Wt 193.0 lb

## 2014-08-31 DIAGNOSIS — M7989 Other specified soft tissue disorders: Secondary | ICD-10-CM | POA: Diagnosis not present

## 2014-08-31 DIAGNOSIS — H538 Other visual disturbances: Secondary | ICD-10-CM

## 2014-08-31 MED ORDER — NAPROXEN 500 MG PO TABS: 500.0000 mg | ORAL_TABLET | Freq: Two times a day (BID) | ORAL | Status: DC

## 2014-08-31 MED ORDER — DICLOFENAC SODIUM 75 MG PO TBEC: 75.0000 mg | DELAYED_RELEASE_TABLET | Freq: Two times a day (BID) | ORAL | Status: DC

## 2014-08-31 NOTE — Addendum Note (Signed)
Addended by: Legrand RamsWILLIS, Teauna Dubach B on: 08/31/2014 04:45 PM   Modules accepted: Orders, Medications

## 2014-08-31 NOTE — Progress Notes (Signed)
   Subjective:    Patient ID: Matthew Lucero, male    DOB: Nov 11, 1995, 19 y.o.   MRN: [id number]  HPI Patient presents complaining of a 24-hour history of swelling in his left hand. Patient developed sudden spontaneous swelling around the fourth MCP joint. The swelling has slowly started to improve. He denies any trauma or injury to this area. He denies punching any solid surfaces. Today there is minimal swelling around the fourth MCP joint. Patient has normal range of motion in the fourth MCP joint without pain. There is no erythema. There is no crepitus in the joint.    Patient also complains of blurry vision in his right eye. This has been ongoing for years and is stable. On vision screen today he is 20/40 OD and 20/25 OS.  He would like to see Dr. Dione BoozeGroat for glasses. Past Medical History  Diagnosis Date  . ADHD (attention deficit hyperactivity disorder)    No past surgical history on file. Current Outpatient Prescriptions on File Prior to Visit  Medication Sig Dispense Refill  . amphetamine-dextroamphetamine (ADDERALL XR) 20 MG 24 hr capsule Take 20 mg by mouth See admin instructions. Only when is school     No current facility-administered medications on file prior to visit.   Allergies  Allergen Reactions  . Bee Venom Swelling   History   Social History  . Marital Status: Single    Spouse Name: N/A  . Number of Children: N/A  . Years of Education: N/A   Occupational History  . Not on file.   Social History Main Topics  . Smoking status: Current Every Day Smoker    Types: Cigarettes, Cigars  . Smokeless tobacco: Not on file     Comment: 2-3 cigs a day/Occasional cigar  . Alcohol Use: No  . Drug Use: No  . Sexual Activity: Yes   Other Topics Concern  . Not on file   Social History Narrative      Review of Systems  All other systems reviewed and are negative.      Objective:   Physical Exam  Eyes: Conjunctivae and EOM are normal. Pupils are equal, round, and  reactive to light. Right eye exhibits no discharge. Left eye exhibits no discharge. No scleral icterus.  Cardiovascular: Normal rate and regular rhythm.   Pulmonary/Chest: Effort normal and breath sounds normal.  Musculoskeletal:       Left hand: He exhibits swelling. He exhibits normal range of motion, no tenderness, normal two-point discrimination and no deformity. Normal sensation noted. Decreased sensation is not present in the ulnar distribution, is not present in the medial redistribution and is not present in the radial distribution. Normal strength noted. He exhibits no finger abduction and no thumb/finger opposition.  Vitals reviewed.         Assessment & Plan:  Swelling of left hand - Plan: diclofenac (VOLTAREN) 75 MG EC tablet, DG Hand Complete Left  Blurry vision, right eye  Treat the swelling in hte left hand empirically with voltaren 75 mg pobid and obtain xray of the hand to evaluate for underlying skeletal abnormailities although exam today is benign.  I will consult Dr. Dione BoozeGroat as patient may benefit from glasses.

## 2014-09-03 ENCOUNTER — Ambulatory Visit (INDEPENDENT_AMBULATORY_CARE_PROVIDER_SITE_OTHER): Payer: Medicaid Other | Admitting: Family Medicine

## 2014-09-03 ENCOUNTER — Encounter: Payer: Self-pay | Admitting: Family Medicine

## 2014-09-03 VITALS — BP 120/74 | HR 86 | Temp 98.5°F | Resp 16 | Wt 191.0 lb

## 2014-09-03 DIAGNOSIS — G43809 Other migraine, not intractable, without status migrainosus: Secondary | ICD-10-CM | POA: Diagnosis not present

## 2014-09-03 DIAGNOSIS — Z7251 High risk heterosexual behavior: Secondary | ICD-10-CM

## 2014-09-03 NOTE — Addendum Note (Signed)
Addended by: Alean RinneLONG, Cordella Nyquist A on: 09/03/2014 12:50 PM   Modules accepted: Orders

## 2014-09-03 NOTE — Progress Notes (Signed)
   Subjective:    Patient ID: Matthew GandyJaryn M Roy, male    DOB: 08/23/1995, 19 y.o.   MRN: [id number]  HPI Patient is requesting STD testing. He has been exposed to chlamydia with one of his sexual partners. He also would like HIV testing. Patient also reports that he is having frequent migraines. He would like to see a neurologist to discuss treatment options for this. He's had 2 in the last year. He also smokes. Today he smells of marijuana although he denies marijuana. He definitely smokes tobacco. I've given the patient the option of taking Imitrex as needed for migraines given the low frequency of the headaches. He was still like to see a neurologist. Past Medical History  Diagnosis Date  . ADHD (attention deficit hyperactivity disorder)    No past surgical history on file. Current Outpatient Prescriptions on File Prior to Visit  Medication Sig Dispense Refill  . amphetamine-dextroamphetamine (ADDERALL XR) 20 MG 24 hr capsule Take 20 mg by mouth See admin instructions. Only when is school    . naproxen (NAPROSYN) 500 MG tablet Take 1 tablet (500 mg total) by mouth 2 (two) times daily with a meal. 60 tablet 0   No current facility-administered medications on file prior to visit.   Allergies  Allergen Reactions  . Bee Venom Swelling   History   Social History  . Marital Status: Single    Spouse Name: N/A  . Number of Children: N/A  . Years of Education: N/A   Occupational History  . Not on file.   Social History Main Topics  . Smoking status: Current Every Day Smoker    Types: Cigarettes, Cigars  . Smokeless tobacco: Not on file     Comment: 2-3 cigs a day/Occasional cigar  . Alcohol Use: No  . Drug Use: No  . Sexual Activity: Yes   Other Topics Concern  . Not on file   Social History Narrative      Review of Systems  All other systems reviewed and are negative.      Objective:   Physical Exam  Constitutional: He is oriented to person, place, and time.    Cardiovascular: Normal rate, regular rhythm and normal heart sounds.   No murmur heard. Pulmonary/Chest: Effort normal and breath sounds normal.  Genitourinary: Testes normal and penis normal.  Neurological: He is alert and oriented to person, place, and time. He has normal reflexes.  Vitals reviewed.         Assessment & Plan:  High-risk sexual behavior - Plan: HIV antibody, GC/Chlamydia Probe Amp  Other type of nonintractable migraine - Plan: Ambulatory referral to Neurology  I will check the patient for HIV as well as gonorrhea and chlamydia. I will agree to the patient's wishes and arrange a second opinion with neurology however I believe the patient's migraines would best be addressed by taking Imitrex on an as-needed basis. Also recommended smoking cessation and avoidance of illicit substances which may contribute to the migraine.

## 2014-09-04 LAB — GC/CHLAMYDIA PROBE AMP
CT Probe RNA: NEGATIVE
GC PROBE AMP APTIMA: NEGATIVE

## 2014-09-04 LAB — HIV ANTIBODY (ROUTINE TESTING W REFLEX): HIV: NONREACTIVE

## 2014-09-21 ENCOUNTER — Encounter: Payer: Self-pay | Admitting: *Deleted

## 2014-11-08 ENCOUNTER — Ambulatory Visit: Payer: Medicaid Other | Admitting: Neurology

## 2015-10-03 ENCOUNTER — Encounter (HOSPITAL_COMMUNITY): Payer: Self-pay | Admitting: Emergency Medicine

## 2015-10-03 ENCOUNTER — Emergency Department (HOSPITAL_COMMUNITY)
Admission: EM | Admit: 2015-10-03 | Discharge: 2015-10-03 | Disposition: A | Discharge status: Home / Self Care | Payer: Medicaid Other | Attending: Emergency Medicine | Admitting: Emergency Medicine

## 2015-10-03 ENCOUNTER — Emergency Department (HOSPITAL_COMMUNITY): Payer: Medicaid Other

## 2015-10-03 DIAGNOSIS — J40 Bronchitis, not specified as acute or chronic: Secondary | ICD-10-CM | POA: Insufficient documentation

## 2015-10-03 DIAGNOSIS — R05 Cough: Secondary | ICD-10-CM | POA: Diagnosis present

## 2015-10-03 DIAGNOSIS — R0789 Other chest pain: Secondary | ICD-10-CM | POA: Diagnosis not present

## 2015-10-03 DIAGNOSIS — F909 Attention-deficit hyperactivity disorder, unspecified type: Secondary | ICD-10-CM | POA: Diagnosis not present

## 2015-10-03 DIAGNOSIS — F17210 Nicotine dependence, cigarettes, uncomplicated: Secondary | ICD-10-CM | POA: Insufficient documentation

## 2015-10-03 MED ORDER — NAPROXEN 500 MG PO TABS: 500.0000 mg | ORAL_TABLET | Freq: Two times a day (BID) | ORAL | Status: DC

## 2015-10-03 MED ORDER — BENZONATATE 100 MG PO CAPS: 100.0000 mg | ORAL_CAPSULE | Freq: Three times a day (TID) | ORAL | Status: DC

## 2015-10-03 NOTE — ED Notes (Signed)
Declined W/C at D/C and was escorted to lobby by RN.Declined W/C at D/C and was escorted to lobby by RN. 

## 2015-10-03 NOTE — ED Provider Notes (Signed)
CSN: [id number]     Arrival date & time 10/03/15  1204 History  By signing my name below, I, Hollace HaywardAndrew Hiatt, attest that this documentation has been prepared under the direction and in the presence of Renne CriglerJoshua Ophie Burrowes, PA-C.   Electronically Signed: Hollace HaywardAndrew Hiatt, ED Scribe. 10/03/2015. 1:26 PM.   Chief Complaint  Patient presents with  . Cough   The history is provided by the patient. No language interpreter was used.   HPI Comments: Matthew Lucero is a 20 y.o. male who presents to the Emergency Department complaining of gradually worsening, persistent, productive cough with green sputum onset 2 days ago. Pt has associated rhinorrhea, right-sided CP, and congestion. Pt reports that he woke up with his symptoms after a night of drinking. He has been sick three times in the past 3 months, with associated symptoms of rhinorrhea and cough each time. Pt denies fevers or abdominal pain.   Past Medical History  Diagnosis Date  . ADHD (attention deficit hyperactivity disorder)    History reviewed. No pertinent past surgical history. No family history on file. Social History  Substance Use Topics  . Smoking status: Current Every Day Smoker    Types: Cigarettes, Cigars  . Smokeless tobacco: None     Comment: 2-3 cigs a day/Occasional cigar  . Alcohol Use: No    Review of Systems  Constitutional: Negative for fever, chills and fatigue.  HENT: Positive for congestion and rhinorrhea. Negative for ear pain, sinus pressure and sore throat.   Eyes: Negative for redness.  Respiratory: Positive for cough. Negative for wheezing.   Cardiovascular: Positive for chest pain.  Gastrointestinal: Negative for nausea, vomiting, abdominal pain and diarrhea.  Genitourinary: Negative for dysuria.  Musculoskeletal: Negative for myalgias and neck stiffness.  Skin: Negative for rash.  Neurological: Negative for headaches.  Hematological: Negative for adenopathy.   Allergies  Bee venom  Home Medications    Prior to Admission medications   Medication Sig Start Date End Date Taking? Authorizing Provider  amphetamine-dextroamphetamine (ADDERALL XR) 20 MG 24 hr capsule Take 20 mg by mouth See admin instructions. Only when is school    Historical Provider, MD  benzonatate (TESSALON) 100 MG capsule Take 1 capsule (100 mg total) by mouth every 8 (eight) hours. 10/03/15   Renne CriglerJoshua Artrell Lawless, PA-C  naproxen (NAPROSYN) 500 MG tablet Take 1 tablet (500 mg total) by mouth 2 (two) times daily. 10/03/15   Renne CriglerJoshua Baine Decesare, PA-C   BP 136/86 mmHg  Pulse 75  Temp(Src) 98.6 F (37 C) (Oral)  Resp 19  Ht 6\' 3"  (1.905 m)  Wt 200 lb (90.719 kg)  BMI 25.00 kg/m2  SpO2 100%   Physical Exam  Constitutional: He appears well-developed and well-nourished.  HENT:  Head: Normocephalic and atraumatic.  Right Ear: Tympanic membrane, external ear and ear canal normal.  Left Ear: Tympanic membrane, external ear and ear canal normal.  Nose: Nose normal. No mucosal edema or rhinorrhea.  Mouth/Throat: Uvula is midline, oropharynx is clear and moist and mucous membranes are normal. Mucous membranes are not dry. No trismus in the jaw. No uvula swelling. No oropharyngeal exudate, posterior oropharyngeal edema, posterior oropharyngeal erythema or tonsillar abscesses.  Eyes: Conjunctivae are normal. Right eye exhibits no discharge. Left eye exhibits no discharge.  Neck: Normal range of motion. Neck supple.  Cardiovascular: Normal rate, regular rhythm and normal heart sounds.   Pulmonary/Chest: Effort normal and breath sounds normal. No respiratory distress. He has no wheezes. He has no rales. He exhibits tenderness (  R-sided).  Occasional cough during exam.  Abdominal: Soft. There is no tenderness.  Neurological: He is alert.  Skin: Skin is warm and dry.  Psychiatric: He has a normal mood and affect.  Nursing note and vitals reviewed.   ED Course  Procedures (including critical care time)  DIAGNOSTIC STUDIES: Oxygen Saturation  is 100% on RA, normal by my interpretation.   COORDINATION OF CARE: 1:20 PM-Discussed next steps with pt including a CXR. Pt verbalized understanding and is agreeable with the plan.   Imaging Review Dg Chest 2 View  10/03/2015  CLINICAL DATA:  Right chest pain and congestion. EXAM: CHEST  2 VIEW COMPARISON:  03/12/2013 FINDINGS: The heart size and mediastinal contours are within normal limits. Both lungs are clear. The visualized skeletal structures are unremarkable. IMPRESSION: No active cardiopulmonary disease. Electronically Signed   By: Elige Ko   On: 10/03/2015 13:58    I have personally reviewed and evaluated these images as part of my medical decision-making.  Vital signs reviewed and are as follows: Filed Vitals:   10/03/15 1232  BP: 136/86  Pulse: 75  Temp: 98.6 F (37 C)  Resp: 19    Patient informed of x-ray results. Will discharge to home with naproxen and Tessalon for symptomatic control.  Patient urged to return with worsening symptoms or other concerns. Patient verbalized understanding and agrees with plan.    MDM   Final diagnoses:  Bronchitis  Chest wall pain    Patient with likely viral bronchitis. No concern for PNA given normal lung exam/x-ray. Antibiotics not indicated. Conservative therapy indicated.    I personally performed the services described in this documentation, which was scribed in my presence. The recorded information has been reviewed and is accurate.     Renne Crigler, PA-C 10/03/15 1417  Lyndal Pulley, MD 10/04/15 [phone]

## 2015-10-03 NOTE — ED Notes (Signed)
Cough and cold for a month coughing makes his chest sore, is congested

## 2015-10-03 NOTE — Discharge Instructions (Signed)
Please read and follow all provided instructions.  Your diagnoses today include:  1. Bronchitis   2. Chest wall pain     Tests performed today include:  Chest x-ray - does not show any pneumonia  Vital signs. See below for your results today.   Medications prescribed:   Naproxen - anti-inflammatory pain medication  Do not exceed 500mg  naproxen every 12 hours, take with food  You have been prescribed an anti-inflammatory medication or NSAID. Take with food. Take smallest effective dose for the shortest duration needed for your pain. Stop taking if you experience stomach pain or vomiting.    Tessalon Perles - cough suppressant medication  Take any prescribed medications only as directed.  Home care instructions:  Follow any educational materials contained in this packet.  Follow-up instructions: Please follow-up with your primary care provider in the next 3 days for further evaluation of your symptoms and a recheck if you are not feeling better.   Return instructions:   Please return to the Emergency Department if you experience worsening symptoms.  Please return with worsening wheezing, shortness of breath, or difficulty breathing.  Return with persistent fever above 101F.   Please return if you have any other emergent concerns.  Additional Information:  Your vital signs today were: BP 136/86 mmHg   Pulse 75   Temp(Src) 98.6 F (37 C) (Oral)   Resp 19   Ht 6\' 3"  (1.905 m)   Wt 90.719 kg   BMI 25.00 kg/m2   SpO2 100% If your blood pressure (BP) was elevated above 135/85 this visit, please have this repeated by your doctor within one month. --------------

## 2016-09-28 ENCOUNTER — Emergency Department (HOSPITAL_COMMUNITY): Payer: Medicaid Other

## 2016-09-28 ENCOUNTER — Emergency Department (HOSPITAL_COMMUNITY)
Admission: EM | Admit: 2016-09-28 | Discharge: 2016-09-28 | Disposition: A | Discharge status: Home / Self Care | Payer: Medicaid Other | Attending: Emergency Medicine | Admitting: Emergency Medicine

## 2016-09-28 ENCOUNTER — Encounter (HOSPITAL_COMMUNITY): Payer: Self-pay | Admitting: Emergency Medicine

## 2016-09-28 DIAGNOSIS — Y92410 Unspecified street and highway as the place of occurrence of the external cause: Secondary | ICD-10-CM | POA: Insufficient documentation

## 2016-09-28 DIAGNOSIS — Z79899 Other long term (current) drug therapy: Secondary | ICD-10-CM | POA: Diagnosis not present

## 2016-09-28 DIAGNOSIS — Y9389 Activity, other specified: Secondary | ICD-10-CM | POA: Diagnosis not present

## 2016-09-28 DIAGNOSIS — M545 Low back pain, unspecified: Secondary | ICD-10-CM

## 2016-09-28 DIAGNOSIS — F17210 Nicotine dependence, cigarettes, uncomplicated: Secondary | ICD-10-CM | POA: Diagnosis not present

## 2016-09-28 DIAGNOSIS — S3992XA Unspecified injury of lower back, initial encounter: Secondary | ICD-10-CM | POA: Diagnosis present

## 2016-09-28 DIAGNOSIS — M25561 Pain in right knee: Secondary | ICD-10-CM | POA: Diagnosis not present

## 2016-09-28 DIAGNOSIS — M5441 Lumbago with sciatica, right side: Secondary | ICD-10-CM | POA: Insufficient documentation

## 2016-09-28 DIAGNOSIS — Y999 Unspecified external cause status: Secondary | ICD-10-CM | POA: Diagnosis not present

## 2016-09-28 DIAGNOSIS — R071 Chest pain on breathing: Secondary | ICD-10-CM | POA: Insufficient documentation

## 2016-09-28 DIAGNOSIS — R202 Paresthesia of skin: Secondary | ICD-10-CM | POA: Insufficient documentation

## 2016-09-28 MED ORDER — KETOROLAC TROMETHAMINE 60 MG/2ML IM SOLN
30.0000 mg | Freq: Once | INTRAMUSCULAR | Status: AC
  Administered 2016-09-28: 30 mg via INTRAMUSCULAR
  Filled 2016-09-28: qty 2
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
## 2016-09-28 NOTE — Discharge Instructions (Signed)
Alternate 600 mg of ibuprofen and 500 mg of Tylenol every 3 hours for the next few days. Ice to areas of soreness for the next few days and then may move to heat. Expect to be sore for the next few day and follow up with primary care physician for recheck of ongoing symptoms but return to ER for emergent changing or worsening of symptoms.
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
## 2018-11-06 ENCOUNTER — Other Ambulatory Visit: Payer: Self-pay

## 2018-11-06 ENCOUNTER — Emergency Department (HOSPITAL_COMMUNITY)
Admission: EM | Admit: 2018-11-06 | Discharge: 2018-11-06 | Disposition: A | Discharge status: Home / Self Care | Payer: Self-pay | Attending: Emergency Medicine | Admitting: Emergency Medicine

## 2018-11-06 ENCOUNTER — Emergency Department (HOSPITAL_COMMUNITY): Payer: Self-pay

## 2018-11-06 ENCOUNTER — Encounter (HOSPITAL_COMMUNITY): Payer: Self-pay | Admitting: *Deleted

## 2018-11-06 DIAGNOSIS — X501XXA Overexertion from prolonged static or awkward postures, initial encounter: Secondary | ICD-10-CM | POA: Insufficient documentation

## 2018-11-06 DIAGNOSIS — Y9389 Activity, other specified: Secondary | ICD-10-CM | POA: Insufficient documentation

## 2018-11-06 DIAGNOSIS — Y999 Unspecified external cause status: Secondary | ICD-10-CM | POA: Insufficient documentation

## 2018-11-06 DIAGNOSIS — Y929 Unspecified place or not applicable: Secondary | ICD-10-CM | POA: Insufficient documentation

## 2018-11-06 DIAGNOSIS — S83411A Sprain of medial collateral ligament of right knee, initial encounter: Secondary | ICD-10-CM

## 2018-11-06 DIAGNOSIS — F17210 Nicotine dependence, cigarettes, uncomplicated: Secondary | ICD-10-CM | POA: Insufficient documentation

## 2018-11-06 MED ORDER — IBUPROFEN 800 MG PO TABS
800.0000 mg | ORAL_TABLET | Freq: Once | ORAL | Status: AC
  Administered 2018-11-06: 800 mg via ORAL
  Filled 2018-11-06: qty 1

## 2018-11-06 NOTE — ED Notes (Signed)
Patient transported to X-ray 
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
## 2018-11-06 NOTE — Discharge Instructions (Addendum)
Please read attached information. If you experience any new or worsening signs or symptoms please return to the emergency room for evaluation. Please follow-up with your primary care provider or specialist as discussed.  °
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
## 2018-12-17 ENCOUNTER — Encounter (HOSPITAL_COMMUNITY): Payer: Self-pay | Admitting: *Deleted

## 2018-12-17 ENCOUNTER — Emergency Department (HOSPITAL_COMMUNITY)
Admission: EM | Admit: 2018-12-17 | Discharge: 2018-12-17 | Disposition: A | Discharge status: Home / Self Care | Payer: Self-pay | Attending: Emergency Medicine | Admitting: Emergency Medicine

## 2018-12-17 ENCOUNTER — Other Ambulatory Visit: Payer: Self-pay

## 2018-12-17 DIAGNOSIS — J020 Streptococcal pharyngitis: Secondary | ICD-10-CM | POA: Insufficient documentation

## 2018-12-17 DIAGNOSIS — Z79899 Other long term (current) drug therapy: Secondary | ICD-10-CM | POA: Insufficient documentation

## 2018-12-17 DIAGNOSIS — F17210 Nicotine dependence, cigarettes, uncomplicated: Secondary | ICD-10-CM | POA: Insufficient documentation

## 2018-12-17 DIAGNOSIS — M545 Low back pain: Secondary | ICD-10-CM | POA: Insufficient documentation

## 2018-12-17 LAB — GROUP A STREP BY PCR: Group A Strep by PCR: DETECTED — AB

## 2018-12-17 MED ORDER — PENICILLIN G BENZATHINE 1200000 UNIT/2ML IM SUSP
1.2000 10*6.[IU] | Freq: Once | INTRAMUSCULAR | Status: AC
  Administered 2018-12-17: 19:00:00 1.2 10*6.[IU] via INTRAMUSCULAR
  Filled 2018-12-17: qty 2

## 2018-12-17 NOTE — Discharge Instructions (Signed)
Take Tylenol or Ibuprofen for pain and fever Do warm salt gargles or drink something cool to help soothe the throat You are infectious for 24 hours after receiving the antibiotic shot - please stay at home Return if worsening

## 2018-12-17 NOTE — ED Provider Notes (Signed)
[place] EMERGENCY DEPARTMENT Provider Note   CSN: [id number] Arrival date & time: 12/17/18  1648     History   Chief Complaint Chief Complaint  Patient presents with  . Sore Throat    HPI Matthew Lucero is a 23 y.o. male who presents with a sore throat.  No significant past medical history.  Patient states that last night he developed a fever of 100 along with chills and a sore throat.  Today he has had upper body pain and low back pain.  He had denies any known sick contacts.  He took Tylenol for his symptoms.  Nothing makes it better or worse.  He is not feeling better so he decided to come to the ED.     HPI  Past Medical History:  Diagnosis Date  . ADHD (attention deficit hyperactivity disorder)     Patient Active Problem List   Diagnosis Date Noted  . Migraine, unspecified, without mention of intractable migraine without mention of status migrainosus 10/11/2012  . ADHD (attention deficit hyperactivity disorder)     History reviewed. No pertinent surgical history.      Home Medications    Prior to Admission medications   Medication Sig Start Date End Date Taking? Authorizing Provider  amphetamine-dextroamphetamine (ADDERALL XR) 20 MG 24 hr capsule Take 20 mg by mouth See admin instructions. Only when is school    [provider]  benzonatate (TESSALON) 100 MG capsule Take 1 capsule (100 mg total) by mouth every 8 (eight) hours. 10/03/15   Carlisle Cater, PA-C  naproxen (NAPROSYN) 500 MG tablet Take 1 tablet (500 mg total) by mouth 2 (two) times daily. 10/03/15   Carlisle Cater, PA-C    Family History No family history on file.  Social History Social History   Tobacco Use  . Smoking status: Current Every Day Smoker    Types: Cigarettes, Cigars  . Smokeless tobacco: Never Used  . Tobacco comment: 2-3 cigs a day/Occasional cigar  Substance Use Topics  . Alcohol use: No  . Drug use: No     Allergies   Bee venom   Review of  Systems Review of Systems  Constitutional: Positive for chills and fever.  HENT: Positive for sore throat.   Musculoskeletal: Positive for myalgias.     Physical Exam Updated Vital Signs BP 125/78 (BP Location: Right Arm)   Pulse 92   Temp 98.8 F (37.1 C) (Oral)   Resp 18   Ht 6\' 2"  (1.88 m)   Wt 97.5 kg   SpO2 100%   BMI 27.60 kg/m   Physical Exam Vitals signs and nursing note reviewed.  Constitutional:      General: He is not in acute distress.    Appearance: He is well-developed. He is not ill-appearing.  HENT:     Head: Normocephalic and atraumatic.     Right Ear: Tympanic membrane normal.     Left Ear: Tympanic membrane normal.     Nose: Nose normal.     Mouth/Throat:     Lips: Pink.     Mouth: Mucous membranes are moist.     Pharynx: Pharyngeal swelling and posterior oropharyngeal erythema present.     Tonsils: Tonsillar exudate present.  Eyes:     General: No scleral icterus.       Right eye: No discharge.        Left eye: No discharge.     Conjunctiva/sclera: Conjunctivae normal.     Pupils: Pupils are equal,  round, and reactive to light.  Neck:     Musculoskeletal: Normal range of motion.  Cardiovascular:     Rate and Rhythm: Normal rate.  Pulmonary:     Effort: Pulmonary effort is normal. No respiratory distress.  Abdominal:     General: There is no distension.  Skin:    General: Skin is warm and dry.  Neurological:     Mental Status: He is alert and oriented to person, place, and time.  Psychiatric:        Behavior: Behavior normal.      ED Treatments / Results  Labs (all labs ordered are listed, but only abnormal results are displayed) Labs Reviewed  GROUP A STREP BY PCR - Abnormal; Notable for the following components:      Result Value   Group A Strep by PCR DETECTED (*)    All other components within normal limits    EKG None  Radiology No results found.  Procedures Procedures (including critical care time)  Medications  Ordered in ED Medications - No data to display   Initial Impression / Assessment and Plan / ED Course  I have reviewed the triage vital signs and the nursing notes.  Pertinent labs & imaging results that were available during my care of the patient were reviewed by me and considered in my medical decision making (see chart for details).  23 year old male with fever, chills, body aches and sore throat since last night.  His strep is positive.  He was given IM penicillin and a work note. Supportive care discussed  Final Clinical Impressions(s) / ED Diagnoses   Final diagnoses:  Strep throat    ED Discharge Orders    None       Bethel BornGekas, Kelly Marie, PA-C 12/17/18 Ayesha [id number]    Geoffery Lyonselo, Douglas, MD 12/17/18 1910

## 2018-12-17 NOTE — ED Triage Notes (Signed)
Generalized body aches since yesterday  Low f grade temp  He has taken tylenol at 1500

## 2018-12-17 NOTE — ED Triage Notes (Signed)
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

## 2018-12-17 NOTE — ED Notes (Signed)
Pt with no signs of adverse reaction to medication.

## 2019-03-24 ENCOUNTER — Other Ambulatory Visit: Payer: Self-pay

## 2019-03-24 DIAGNOSIS — Z20822 Contact with and (suspected) exposure to covid-19: Secondary | ICD-10-CM

## 2019-03-25 LAB — NOVEL CORONAVIRUS, NAA: SARS-CoV-2, NAA: NOT DETECTED

## 2019-10-07 IMAGING — DX RIGHT KNEE - COMPLETE 4+ VIEW
4 series · 4 of 4 positions shown · non-contrast
Comparison: None.

CLINICAL DATA: Medial right knee pain and swelling following a fall
on the knees last night.

EXAM:
RIGHT KNEE - COMPLETE 4+ VIEW

[knee ap]
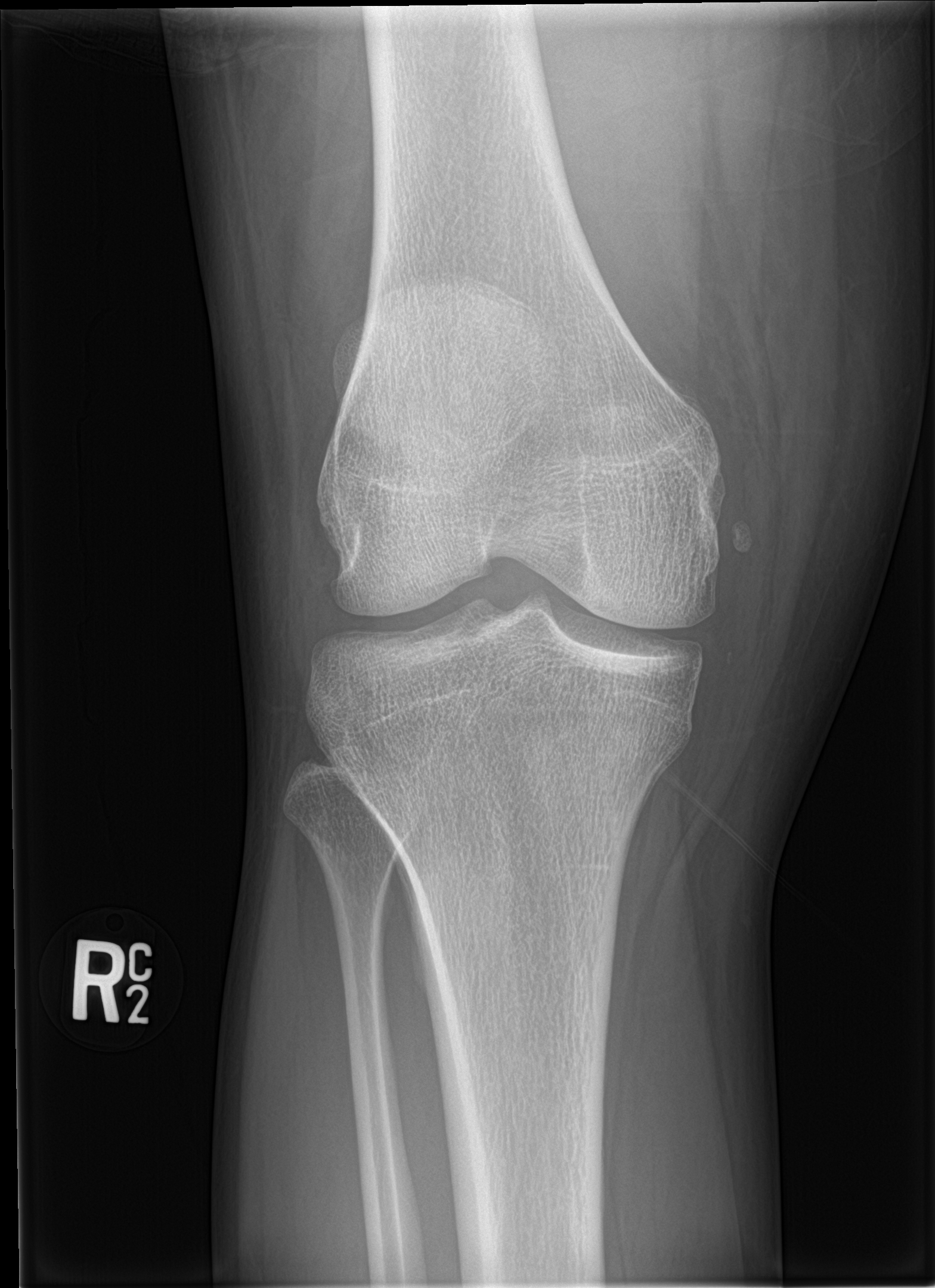

[knee lat]
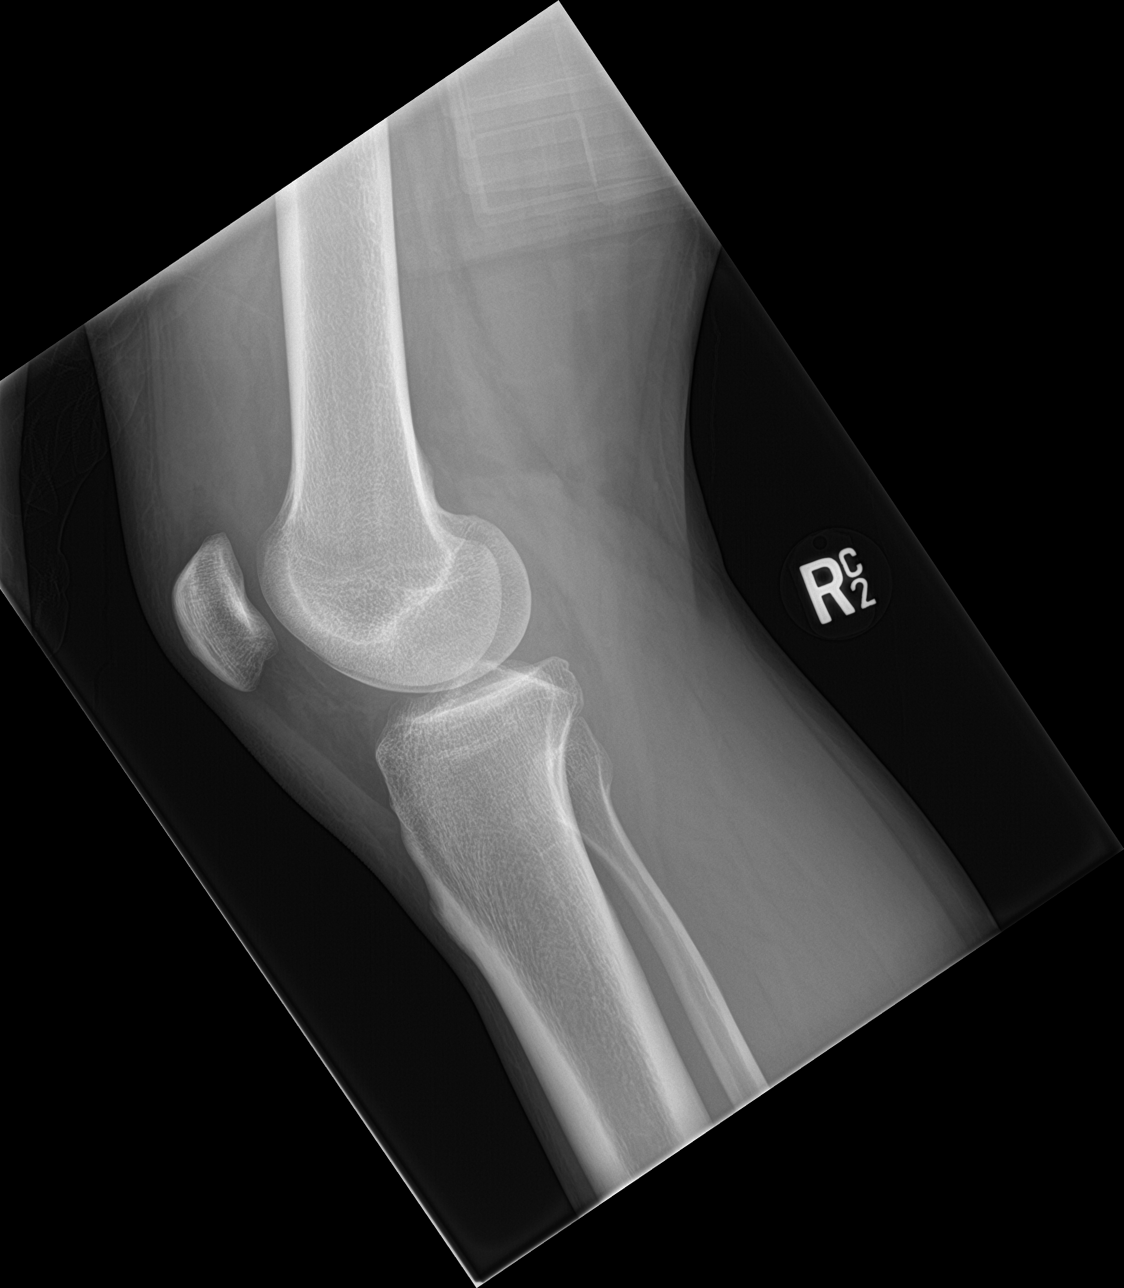

[knee obl (1 of 2)]
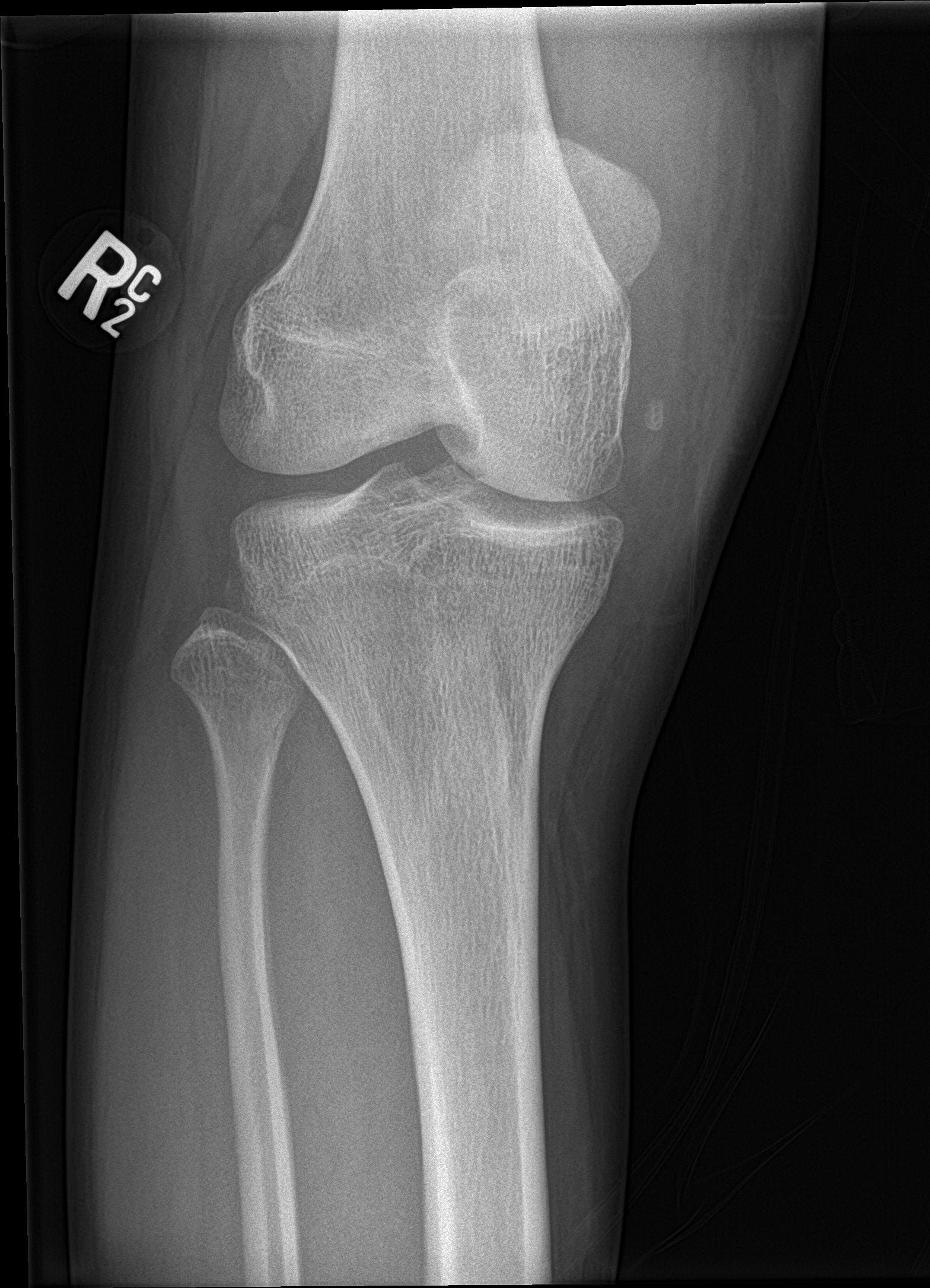

[knee obl (2 of 2)]
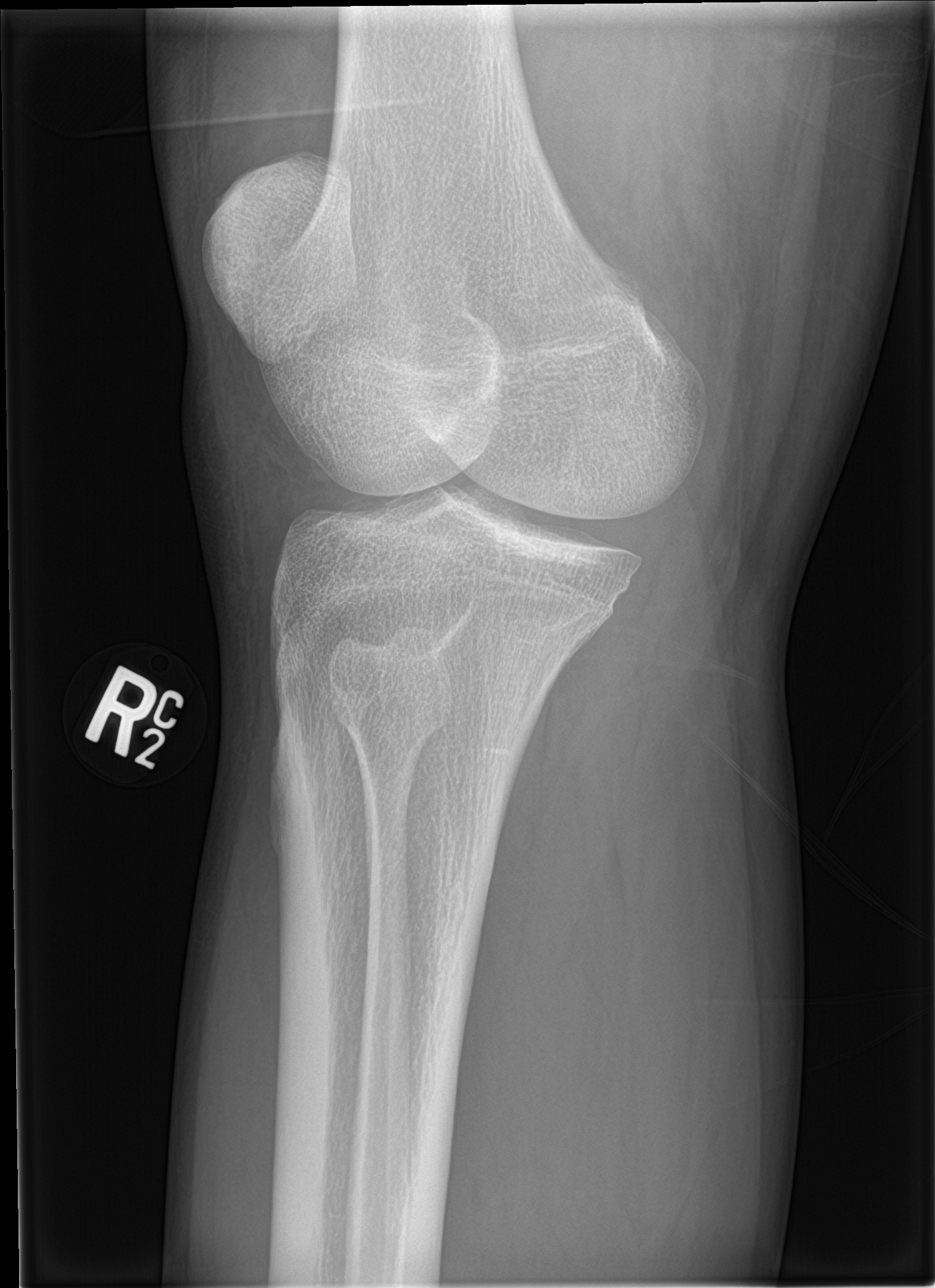

[4 of 4 positions shown; findings below may reference images not displayed]

FINDINGS: No evidence of fracture, dislocation, or joint effusion. No evidence
of arthropathy or other focal bone abnormality. Soft tissues are
unremarkable.
IMPRESSION: Normal examination.

## 2021-02-25 ENCOUNTER — Ambulatory Visit (HOSPITAL_COMMUNITY)
Admission: EM | Admit: 2021-02-25 | Discharge: 2021-02-25 | Disposition: A | Discharge status: Home / Self Care | Payer: PRIVATE HEALTH INSURANCE | Attending: Medical Oncology | Admitting: Medical Oncology

## 2021-02-25 ENCOUNTER — Ambulatory Visit (HOSPITAL_COMMUNITY): Payer: Self-pay

## 2021-02-25 ENCOUNTER — Other Ambulatory Visit: Payer: Self-pay

## 2021-02-25 ENCOUNTER — Encounter (HOSPITAL_COMMUNITY): Payer: Self-pay

## 2021-02-25 DIAGNOSIS — J0140 Acute pansinusitis, unspecified: Secondary | ICD-10-CM | POA: Diagnosis not present

## 2021-02-25 DIAGNOSIS — J069 Acute upper respiratory infection, unspecified: Secondary | ICD-10-CM

## 2021-02-25 MED ORDER — FLUTICASONE PROPIONATE 50 MCG/ACT NA SUSP: 2.0000 | Freq: Every day | NASAL | 0 refills | Status: AC

## 2021-02-25 MED ORDER — BENZONATATE 100 MG PO CAPS: 100.0000 mg | ORAL_CAPSULE | Freq: Three times a day (TID) | ORAL | 0 refills | Status: AC

## 2021-02-25 MED ORDER — PREDNISONE 10 MG PO TABS: ORAL_TABLET | ORAL | 0 refills | Status: AC

## 2021-02-25 NOTE — ED Provider Notes (Signed)
[facility]    CSN: [id number] Arrival date & time: 02/25/21  1151      History   Chief Complaint Chief Complaint  Patient presents with   Cough   Sore Throat   Headache    HPI Matthew Lucero is a 25 y.o. male.   HPI  Cold Symptoms: Pt states that for the past 2 weeks he has had dry cough, sore throat, sinus headaches. He has tried OTC Nyquil for symptoms without improvement overall. He denies fevers, hemoptysis, SOB. He reports that her son has similar symptoms.    Past Medical History:  Diagnosis Date   ADHD (attention deficit hyperactivity disorder)     Patient Active Problem List   Diagnosis Date Noted   Migraine, unspecified, without mention of intractable migraine without mention of status migrainosus 10/11/2012   ADHD (attention deficit hyperactivity disorder)     History reviewed. No pertinent surgical history.     Home Medications    Prior to Admission medications   Not on File    Family History History reviewed. No pertinent family history.  Social History Social History   Tobacco Use   Smoking status: Every Day    Types: Cigarettes, Cigars   Smokeless tobacco: Never   Tobacco comments:    2-3 cigs a day/Occasional cigar  Vaping Use   Vaping Use: Never used  Substance Use Topics   Alcohol use: No   Drug use: No     Allergies   Bee venom   Review of Systems Review of Systems  As stated above in HPI Physical Exam Triage Vital Signs ED Triage Vitals  Enc Vitals Group     BP 02/25/21 1219 121/78     Pulse Rate 02/25/21 1219 73     Resp 02/25/21 1219 16     Temp 02/25/21 1219 97.8 F (36.6 C)     Temp Source 02/25/21 1219 Oral     SpO2 02/25/21 1219 96 %     Weight --      Height --      Head Circumference --      Peak Flow --      Pain Score 02/25/21 1218 0     Pain Loc --      Pain Edu? --      Excl. in GC? --    No data found.  Updated Vital Signs BP 121/78 (BP Location: Left Arm)   Pulse 73    Temp 97.8 F (36.6 C) (Oral)   Resp 16   SpO2 96%   Physical Exam Vitals and nursing note reviewed.  Constitutional:      General: He is not in acute distress.    Appearance: He is well-developed. He is not ill-appearing, toxic-appearing or diaphoretic.  HENT:     Head: Normocephalic and atraumatic.     Right Ear: A middle ear effusion is present. Tympanic membrane is not erythematous.     Left Ear: A middle ear effusion is present. Tympanic membrane is not erythematous.     Nose: Congestion (No sinus bogginess but there is mild sinus effusion without exquisite tenderness) present.     Mouth/Throat:     Mouth: Mucous membranes are moist.     Pharynx: Oropharynx is clear. Uvula midline. No oropharyngeal exudate, posterior oropharyngeal erythema or uvula swelling.     Tonsils: No tonsillar exudate or tonsillar abscesses.  Eyes:     Conjunctiva/sclera: Conjunctivae normal.     Pupils: Pupils are equal,  round, and reactive to light.  Cardiovascular:     Rate and Rhythm: Normal rate and regular rhythm.     Heart sounds: Normal heart sounds.  Pulmonary:     Effort: Pulmonary effort is normal.     Breath sounds: Normal breath sounds.  Musculoskeletal:     Cervical back: Neck supple.  Lymphadenopathy:     Cervical: No cervical adenopathy.  Skin:    General: Skin is warm.  Neurological:     Mental Status: He is alert and oriented to person, place, and time.     UC Treatments / Results  Labs (all labs ordered are listed, but only abnormal results are displayed) Labs Reviewed - No data to display  EKG   Radiology No results found.  Procedures Procedures (including critical care time)  Medications Ordered in UC Medications - No data to display  Initial Impression / Assessment and Plan / UC Course  I have reviewed the triage vital signs and the nursing notes.  Pertinent labs & imaging results that were available during my care of the patient were reviewed by me and  considered in my medical decision making (see chart for details).     New.  Likely originally viral and now is starting to progress.  I Minna treat him with low-dose steroids along with fluticasone and Tessalon to help bacterial sinusitis or worsening symptoms.  Discussed how to use these medications along, potential side effects and precautions with patient.  Rest and hydration encouraged.  Nettie sinus rinses encouraged.  Follow-up PRN. Final Clinical Impressions(s) / UC Diagnoses   Final diagnoses:  None   Discharge Instructions   None    ED Prescriptions   None    PDMP not reviewed this encounter.   Rushie Chestnut, [place] 02/25/21 1254

## 2021-02-25 NOTE — ED Triage Notes (Signed)
Pt is c/o of cough, sore throat, and headaches for the past two weeks; pt states that he has tried to take some otc medication (Nyquil) but has not gotten better; pt has a 27 year old son who attends day care with the same symptoms

## 2021-02-25 NOTE — Discharge Instructions (Addendum)
NetiPot or NeilMed sinus rinse
